# Patient Record
Sex: Female | Born: 1995 | Race: Asian | Hispanic: Yes | Marital: Single | Smoking: Never smoker
Health system: Southern US, Community
[De-identification: ages and names within clinical notes are randomized; demographics above are authoritative.]

---

## 2018-01-04 ENCOUNTER — Ambulatory Visit (INDEPENDENT_AMBULATORY_CARE_PROVIDER_SITE_OTHER): Payer: Self-pay

## 2018-01-04 ENCOUNTER — Encounter (HOSPITAL_COMMUNITY): Payer: Self-pay | Admitting: Emergency Medicine

## 2018-01-04 ENCOUNTER — Ambulatory Visit (HOSPITAL_COMMUNITY)
Admission: EM | Admit: 2018-01-04 | Discharge: 2018-01-04 | Disposition: A | Discharge status: Home / Self Care | Payer: Self-pay | Attending: Internal Medicine | Admitting: Internal Medicine

## 2018-01-04 DIAGNOSIS — M5489 Other dorsalgia: Secondary | ICD-10-CM

## 2018-01-04 DIAGNOSIS — R0789 Other chest pain: Secondary | ICD-10-CM

## 2018-01-04 DIAGNOSIS — S1091XA Abrasion of unspecified part of neck, initial encounter: Secondary | ICD-10-CM

## 2018-01-04 DIAGNOSIS — M542 Cervicalgia: Secondary | ICD-10-CM
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
